# Patient Record
Sex: Male | Born: 1975 | Race: Black or African American | Hispanic: No | Marital: Single
Health system: Southern US, Community
[De-identification: ages and names within clinical notes are randomized; demographics above are authoritative.]

## PROBLEM LIST (undated history)

## (undated) DIAGNOSIS — F1910 Other psychoactive substance abuse, uncomplicated: Secondary | ICD-10-CM

---

## 2006-11-12 ENCOUNTER — Emergency Department: Payer: Self-pay

## 2009-02-26 ENCOUNTER — Emergency Department: Payer: Self-pay | Admitting: Emergency Medicine

## 2009-10-30 ENCOUNTER — Emergency Department: Payer: Self-pay | Admitting: Emergency Medicine

## 2009-11-11 ENCOUNTER — Emergency Department: Payer: Self-pay | Admitting: Emergency Medicine

## 2010-08-01 ENCOUNTER — Emergency Department: Payer: Self-pay | Admitting: Emergency Medicine
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
## 2011-08-10 ENCOUNTER — Emergency Department: Payer: Self-pay | Admitting: Emergency Medicine

## 2012-05-13 ENCOUNTER — Encounter (HOSPITAL_COMMUNITY): Payer: Self-pay | Admitting: Emergency Medicine

## 2012-05-13 ENCOUNTER — Emergency Department (HOSPITAL_COMMUNITY)
Admission: EM | Admit: 2012-05-13 | Discharge: 2012-05-13 | Disposition: A | Discharge status: Home / Self Care | Payer: Self-pay | Attending: Emergency Medicine | Admitting: Emergency Medicine

## 2012-05-13 DIAGNOSIS — R112 Nausea with vomiting, unspecified: Secondary | ICD-10-CM | POA: Insufficient documentation

## 2012-05-13 DIAGNOSIS — F17200 Nicotine dependence, unspecified, uncomplicated: Secondary | ICD-10-CM | POA: Insufficient documentation

## 2012-05-13 LAB — BASIC METABOLIC PANEL
CO2: 28 mEq/L (ref 19–32)
Chloride: 95 mEq/L — ABNORMAL LOW (ref 96–112)
Creatinine, Ser: 1.03 mg/dL (ref 0.50–1.35)
Glucose, Bld: 123 mg/dL — ABNORMAL HIGH (ref 70–99)
Sodium: 136 mEq/L (ref 135–145)

## 2012-05-13 LAB — CBC WITH DIFFERENTIAL/PLATELET
Basophils Absolute: 0 10*3/uL (ref 0.0–0.1)
Eosinophils Absolute: 0.2 10*3/uL (ref 0.0–0.7)
Lymphocytes Relative: 19 % (ref 12–46)
MCH: 32.5 pg (ref 26.0–34.0)
MCHC: 35.7 g/dL (ref 30.0–36.0)
Monocytes Absolute: 1.9 10*3/uL — ABNORMAL HIGH (ref 0.1–1.0)
Neutro Abs: 11 10*3/uL — ABNORMAL HIGH (ref 1.7–7.7)
Neutrophils Relative %: 68 % (ref 43–77)
RDW: 13.6 % (ref 11.5–15.5)

## 2012-05-13 MED ORDER — ONDANSETRON HCL 4 MG PO TABS: 4.0000 mg | ORAL_TABLET | Freq: Four times a day (QID) | ORAL | Status: AC

## 2012-05-13 MED ORDER — ONDANSETRON HCL 4 MG/2ML IJ SOLN
4.0000 mg | Freq: Once | INTRAMUSCULAR | Status: AC
  Administered 2012-05-13: 4 mg via INTRAVENOUS
  Filled 2012-05-13: qty 2

## 2012-05-13 NOTE — ED Provider Notes (Signed)
History     CSN: [id number]  Arrival date & time 05/13/12  0900   First MD Initiated Contact with Patient 05/13/12 [phone]      Chief Complaint  Patient presents with  . Emesis    (Consider location/radiation/quality/duration/timing/severity/associated sxs/prior treatment) Patient is a 36 y.o. male presenting with vomiting. The history is provided by the patient.  Emesis  This is a new problem. The current episode started more than 2 days ago. The problem occurs 2 to 4 times per day. Pertinent negatives include no abdominal pain and no fever. Associated symptoms comments: Nausea, vomiting for the past 3 days. He had diarrhea on day one, but none since. No abdominal pain, fever, bloody emesis or stool. No sick contacts..    History reviewed. No pertinent past medical history.  History reviewed. No pertinent past surgical history.  No family history on file.  History  Substance Use Topics  . Smoking status: Current Everyday Smoker  . Smokeless tobacco: Not on file  . Alcohol Use: Yes      Review of Systems  Constitutional: Negative for fever.  Respiratory: Negative for shortness of breath.   Cardiovascular: Negative for chest pain.  Gastrointestinal: Positive for nausea and vomiting. Negative for abdominal pain.  Genitourinary: Negative for dysuria and decreased urine volume.  Skin: Negative for rash.    Allergies  Review of patient's allergies indicates no known allergies.  Home Medications  No current outpatient prescriptions on file.  BP 138/78  Pulse 64  Temp 99.5 F (37.5 C) (Oral)  Resp 20  SpO2 98%  Physical Exam  Constitutional: He appears well-developed and well-nourished. No distress.  HENT:  Head: Normocephalic.  Neck: Normal range of motion. Neck supple.  Cardiovascular: Normal rate and regular rhythm.   Pulmonary/Chest: Effort normal and breath sounds normal.  Abdominal: Soft. Bowel sounds are normal. There is no tenderness. There is no rebound  and no guarding.       Abdomen is completely non-tender to light and deep palpation.  Musculoskeletal: Normal range of motion.  Neurological: He is alert. No cranial nerve deficit.  Skin: Skin is warm and dry. No rash noted.  Psychiatric: He has a normal mood and affect.    ED Course  Procedures (including critical care time)  Labs Reviewed  CBC WITH DIFFERENTIAL - Abnormal; Notable for the following:    WBC 16.2 (*)     Neutro Abs 11.0 (*)     Monocytes Absolute 1.9 (*)     All other components within normal limits  BASIC METABOLIC PANEL - Abnormal; Notable for the following:    Potassium 3.3 (*)     Chloride 95 (*)     Glucose, Bld 123 (*)     All other components within normal limits   Results for orders placed during the hospital encounter of 05/13/12  CBC WITH DIFFERENTIAL      Component Value Range   WBC 16.2 (*) 4.0 - 10.5 K/uL   RBC 4.89  4.22 - 5.81 MIL/uL   Hemoglobin 15.9  13.0 - 17.0 g/dL   HCT 11.9  14.7 - 82.9 %   MCV 91.2  78.0 - 100.0 fL   MCH 32.5  26.0 - 34.0 pg   MCHC 35.7  30.0 - 36.0 g/dL   RDW 56.2  13.0 - 86.5 %   Platelets 236  150 - 400 K/uL   Neutrophils Relative 68  43 - 77 %   Lymphocytes Relative 19  12 - 46 %  Monocytes Relative 12  3 - 12 %   Eosinophils Relative 1  0 - 5 %   Basophils Relative 0  0 - 1 %   Neutro Abs 11.0 (*) 1.7 - 7.7 K/uL   Lymphs Abs 3.1  0.7 - 4.0 K/uL   Monocytes Absolute 1.9 (*) 0.1 - 1.0 K/uL   Eosinophils Absolute 0.2  0.0 - 0.7 K/uL   Basophils Absolute 0.0  0.0 - 0.1 K/uL   Smear Review MORPHOLOGY UNREMARKABLE    BASIC METABOLIC PANEL      Component Value Range   Sodium 136  135 - 145 mEq/L   Potassium 3.3 (*) 3.5 - 5.1 mEq/L   Chloride 95 (*) 96 - 112 mEq/L   CO2 28  19 - 32 mEq/L   Glucose, Bld 123 (*) 70 - 99 mg/dL   BUN 17  6 - 23 mg/dL   Creatinine, Ser 1.61  0.50 - 1.35 mg/dL   Calcium 09.6  8.4 - 04.5 mg/dL   GFR calc non Af Amer >90  >90 mL/min   GFR calc Af Amer >90  >90 mL/min    No  results found.   No diagnosis found. 1. Nausea and vomiting   MDM  IV fluids given. 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 Suspect this is reaction to vomiting. No vomiting here. Tolerating PO fluids.         Rodena Medin, PA-C 05/13/12 1100

## 2012-05-13 NOTE — ED Notes (Signed)
Pt. Stated, I've been vomiting for 3 days, I've been attempting to rink and no success.

## 2012-05-16 NOTE — ED Provider Notes (Signed)
Medical screening examination/treatment/procedure(s) were performed by non-physician practitioner and as supervising physician I was immediately available for consultation/collaboration.  Cynda Soule R. Monterius Rolf, MD 05/16/12 2008 

## 2012-09-22 ENCOUNTER — Emergency Department: Payer: Self-pay | Admitting: Emergency Medicine

## 2013-01-31 ENCOUNTER — Emergency Department: Payer: Self-pay | Admitting: Emergency Medicine

## 2013-01-31 LAB — CBC
HCT: 42.3 % (ref 40.0–52.0)
MCHC: 34.4 g/dL (ref 32.0–36.0)
MCV: 95 fL (ref 80–100)
Platelet: 205 10*3/uL (ref 150–440)
RDW: 13.9 % (ref 11.5–14.5)
WBC: 8.3 10*3/uL (ref 3.8–10.6)

## 2013-01-31 LAB — DRUG SCREEN, URINE
Benzodiazepine, Ur Scrn: NEGATIVE (ref ?–200)
Cannabinoid 50 Ng, Ur ~~LOC~~: POSITIVE (ref ?–50)
Cocaine Metabolite,Ur ~~LOC~~: POSITIVE (ref ?–300)
MDMA (Ecstasy)Ur Screen: NEGATIVE (ref ?–500)
Methadone, Ur Screen: NEGATIVE (ref ?–300)
Opiate, Ur Screen: NEGATIVE (ref ?–300)

## 2013-01-31 LAB — COMPREHENSIVE METABOLIC PANEL
Alkaline Phosphatase: 81 U/L (ref 50–136)
Anion Gap: 5 — ABNORMAL LOW (ref 7–16)
BUN: 12 mg/dL (ref 7–18)
Co2: 28 mmol/L (ref 21–32)
Creatinine: 1.25 mg/dL (ref 0.60–1.30)
Glucose: 78 mg/dL (ref 65–99)
Potassium: 3.7 mmol/L (ref 3.5–5.1)
SGOT(AST): 23 U/L (ref 15–37)
Sodium: 141 mmol/L (ref 136–145)
Total Protein: 7.4 g/dL (ref 6.4–8.2)

## 2013-01-31 LAB — URINALYSIS, COMPLETE
Bacteria: NONE SEEN
Glucose,UR: NEGATIVE mg/dL (ref 0–75)
Ketone: NEGATIVE
Leukocyte Esterase: NEGATIVE
Nitrite: NEGATIVE
Ph: 5 (ref 4.5–8.0)
RBC,UR: 8 /HPF (ref 0–5)

## 2013-01-31 LAB — ETHANOL: Ethanol %: <0.003 % (ref 0.000–0.080)

## 2015-06-04 ENCOUNTER — Emergency Department
Admission: EM | Admit: 2015-06-04 | Discharge: 2015-06-04 | Disposition: A | Discharge status: Home / Self Care | Payer: Self-pay

## 2015-06-04 NOTE — ED Notes (Signed)
Pt called twice for triage ,no answer.

## 2015-11-17 ENCOUNTER — Emergency Department
Admission: EM | Admit: 2015-11-17 | Discharge: 2015-11-17 | Disposition: A | Discharge status: Home / Self Care | Payer: Self-pay | Attending: Emergency Medicine | Admitting: Emergency Medicine

## 2015-11-17 ENCOUNTER — Encounter: Payer: Self-pay | Admitting: Emergency Medicine

## 2015-11-17 DIAGNOSIS — F1420 Cocaine dependence, uncomplicated: Secondary | ICD-10-CM | POA: Insufficient documentation

## 2015-11-17 DIAGNOSIS — F17200 Nicotine dependence, unspecified, uncomplicated: Secondary | ICD-10-CM | POA: Insufficient documentation

## 2015-11-17 DIAGNOSIS — F1920 Other psychoactive substance dependence, uncomplicated: Secondary | ICD-10-CM

## 2015-11-17 DIAGNOSIS — F1220 Cannabis dependence, uncomplicated: Secondary | ICD-10-CM | POA: Insufficient documentation

## 2015-11-17 HISTORY — DX: Other psychoactive substance abuse, uncomplicated: F19.10

## 2015-11-17 NOTE — ED Provider Notes (Signed)
[facility] Emergency Department Provider Note  ____________________________________________  Time seen: On arrival  I have reviewed the triage vital signs and the nursing notes.   HISTORY  Chief Complaint Addiction Problem    HPI Alexander Garcia is a 40 y.o. male who is here because he wants rehabilitation/detox for marijuana and crack/cocaine. He reports that he has relapsed over the last 6 months prior to that he had been clean for 2 years after rehabilitation program. He has no physical complaints and feels well currently    Past Medical History  Diagnosis Date  . Drug abuse     There are no active problems to display for this patient.   History reviewed. No pertinent past surgical history.  No current outpatient prescriptions on file.  Allergies Review of patient's allergies indicates no known allergies.  History reviewed. No pertinent family history.  Social History Social History  Substance Use Topics  . Smoking status: Current Every Day Smoker  . Smokeless tobacco: None  . Alcohol Use: Yes    Review of Systems  Constitutional: Negative for fever. Eyes: Negative for visual changes. CV: No chest pain Respiratory: No cough   Musculoskeletal: Negative for joint pain or swelling Skin: Negative for rash. Neurological: Negative for headaches    ____________________________________________   PHYSICAL EXAM:  VITAL SIGNS: ED Triage Vitals  Enc Vitals Group     BP 11/17/15 1102 149/78 mmHg     Pulse Rate 11/17/15 1102 61     Resp 11/17/15 1102 20     Temp 11/17/15 1102 98 F (36.7 C)     Temp Source 11/17/15 1102 Oral     SpO2 11/17/15 1102 97 %     Weight 11/17/15 1102 205 lb (92.987 kg)     Height 11/17/15 1102 6' (1.829 m)     Head Cir --      Peak Flow --      Pain Score 11/17/15 1103 0     Pain Loc --      Pain Edu? --      Excl. in GC? --     Constitutional: Alert and oriented. Well appearing and in no  distress. Pleasant and interactive Eyes: Conjunctivae are normal.  ENT   Head: Normocephalic and atraumatic.   Mouth/Throat: Mucous membranes are moist.  Respiratory: Normal respiratory effort without tachypnea nor retractions.   Musculoskeletal: Nontender with normal range of motion in all extremities. Neurologic:  Normal speech and language. No gross focal neurologic deficits are appreciated. Skin:  Skin is warm, dry and intact. No rash noted. Psychiatric: Mood and affect are normal. Patient exhibits appropriate insight and judgment.  ____________________________________________    LABS (pertinent positives/negatives)  Labs Reviewed - No data to display  ____________________________________________     ____________________________________________    RADIOLOGY I have personally reviewed any xrays that were ordered on this patient: None  ____________________________________________   PROCEDURES  Procedure(s) performed: none   ____________________________________________   INITIAL IMPRESSION / ASSESSMENT AND PLAN / ED COURSE  Pertinent labs & imaging results that were available during my care of the patient were reviewed by me and considered in my medical decision making (see chart for details).  Outpatient resources provided for patient. No indication for labs or further evaluation in the emergency department  ____________________________________________   FINAL CLINICAL IMPRESSION(S) / ED DIAGNOSES  Final diagnoses:  Substance dependence (HCC)     Jene Everyobert Donaciano Range, MD 11/17/15 1439

## 2015-11-17 NOTE — Discharge Instructions (Signed)
Finding Treatment for Addiction WHAT IS ADDICTION? Addiction is a complex disease of the brain. It causes an uncontrollable (compulsive) need for a substance. You can be addicted to alcohol, illegal drugs, or prescription medicines such as painkillers. Addiction can also be a behavior, like gambling or shopping. The need for the drug or activity can become so strong that you think about it all the time. You can also become physically dependent on a substance. Addiction can change the way your brain works. Because of these changes, getting more of whatever you are addicted to becomes the most important thing to you and feels better than other activities or relationships. Addiction can lead to changes in health, behavior, emotions, relationships, and choices that affect you and everyone around you. HOW DO I KNOW IF I NEED TREATMENT FOR ADDICTION? Addiction is a progressive disease. Without treatment, addiction can get worse. Living with addiction puts you at higher risk for injury, poor health, lost employment, loss of money, and even death. You might need treatment for addiction if:  You have tried to stop or cut down, but you cannot.  Your addiction is causing physical health problems.  You find it annoying that your friends and family are concerned about your alcohol or substance use.  You feel guilty about substance abuse or a compulsive behavior.  You have lied or tried to hide your addiction.  You need a particular substance or activity to start your day or to calm down.  You are getting in trouble at school, work, home, or with the police.  You have done something illegal to support your addiction.  You are running out of money because of your addiction.  You have no time for anything other than your addiction. WHAT TYPES OF TREATMENT ARE AVAILABLE? The treatment program that is right for you will depend on many factors, including the type of addiction you have. Treatment programs  can be outpatient or inpatient. In an outpatient program, you live at home and go to work or school, but you also go to a clinic for treatment. With an inpatient program, you live and sleep at the program facility during treatment. After treatment, you might need a plan for support during recovery. Other treatment options include:   Medicine.  Some addictions may be treated with prescription medicines.  You might also need medicine to treat anxiety or depression.  Counseling and behavior therapy. Therapy can help individuals and families behave in healthier ways and relate more effectively.  Support groups. Confidential group therapy, such as a 12-step program, can help individuals and families during treatment and recovery. No single type of program is right for everyone. Many treatment programs involve a combination of education, counseling, and a 12-step, spiritually-based approach. Some treatment programs are government sponsored. They are geared for patients who do not have private insurance. Treatment programs can vary in many respects, such as:  Cost and types of insurance that are accepted.  Types of on-site medical services that are offered.  Length of stay, setting, and size.  Overall philosophy of treatment. WHAT SHOULD I CONSIDER WHEN SELECTING A TREATMENT PROGRAM? It is important to think about your individual requirements when selecting a treatment program. There are a number of things to consider, such as:  If the program is certified by the appropriate government agency. Even private programs must be certified and employ certified professionals.  If the program is covered by your insurance. If finances are a concern, the first call you should make   is to your insurance company, if you have health insurance. Ask for a list of treatment programs that are in your network, and confirm any copayments and deductibles that you may have to pay.  If you do not have insurance, or if  you choose to attend a program that does not accept your insurance, discuss whether a payment plan can be set up.  If treatment is available in languages other than English, if needed.  If the program offers detoxification treatment, if needed.  If 12-step meetings are held at the center or if transport is available for patients to attend meetings at other locations.  If the program is professional, organized, and clean.  If the program meets all of your needs, including physical and cultural needs.  If the facility offers specific treatment for your particular addiction.  If support continues to be offered after you have left the program.  If your treatment plan is continually looked at to make sure you are receiving the right treatment at the right time.  If mental health counseling is part of your treatment.  If medicine is included in treatment, if needed.  If your family is included in your treatment plan and if support is offered to them throughout the treatment process.  How the treatment works to prevent relapse. WHERE ELSE CAN I GET HELP?  Your health care provider. Ask him or her to help you find addiction treatment. These discussions are confidential.  The National Council on Alcoholism and Drug Dependence (NCADD). This group has information about treatment centers and programs for people who have an addiction and for family members.  The telephone number is 1-800-NCA-CALL (1-800-622-2255).  The website is https://ncadd.org/about-ncadd/our-affiliates  The Substance Abuse and Mental Health Services Administration (SAMHSA). This group will help you find publicly funded treatment centers, help hotlines, and counseling services near you.  The telephone number is 1-800-662-HELP (1-800-662-4357).  The website is www.findtreatment.samhsa.gov In countries outside of the [place] and [place], look in local directories for contact information for services in your area.   This  information is not intended to replace advice given to you by your health care provider. Make sure you discuss any questions you have with your health care provider.   Document Released: 07/22/2005 Document Revised: 05/14/2015 Document Reviewed: 06/11/2014 Elsevier Interactive Patient Education 2016 Elsevier Inc.  

## 2015-11-17 NOTE — ED Notes (Signed)
Pt to ed with c/o wanting rehab for marijuana and crack/cocaine.  Pt states hx of drug addiction and successful rehab for several years but recently relapsed. Denies SI, denies HI, denies hallucinations.

## 2019-09-03 ENCOUNTER — Emergency Department
Admission: EM | Admit: 2019-09-03 | Discharge: 2019-09-04 | Disposition: A | Discharge status: Other Acute Inpatient Hospital | Payer: Self-pay | Attending: Student in an Organized Health Care Education/Training Program | Admitting: Student in an Organized Health Care Education/Training Program

## 2019-09-03 ENCOUNTER — Other Ambulatory Visit: Payer: Self-pay

## 2019-09-03 ENCOUNTER — Encounter: Payer: Self-pay | Admitting: Emergency Medicine

## 2019-09-03 DIAGNOSIS — Z20828 Contact with and (suspected) exposure to other viral communicable diseases: Secondary | ICD-10-CM | POA: Insufficient documentation

## 2019-09-03 DIAGNOSIS — F17200 Nicotine dependence, unspecified, uncomplicated: Secondary | ICD-10-CM | POA: Insufficient documentation

## 2019-09-03 DIAGNOSIS — F1910 Other psychoactive substance abuse, uncomplicated: Secondary | ICD-10-CM | POA: Insufficient documentation

## 2019-09-03 LAB — CBC WITH DIFFERENTIAL/PLATELET
Abs Immature Granulocytes: 0.02 10*3/uL (ref 0.00–0.07)
Basophils Absolute: 0.1 10*3/uL (ref 0.0–0.1)
Basophils Relative: 1 %
Eosinophils Absolute: 0.1 10*3/uL (ref 0.0–0.5)
Eosinophils Relative: 1 %
HCT: 44.2 % (ref 39.0–52.0)
Hemoglobin: 15.8 g/dL (ref 13.0–17.0)
Immature Granulocytes: 0 %
Lymphocytes Relative: 28 %
Lymphs Abs: 2.5 10*3/uL (ref 0.7–4.0)
MCH: 31.5 pg (ref 26.0–34.0)
MCHC: 35.7 g/dL (ref 30.0–36.0)
MCV: 88.2 fL (ref 80.0–100.0)
Monocytes Absolute: 0.9 10*3/uL (ref 0.1–1.0)
Monocytes Relative: 10 %
Neutro Abs: 5.5 10*3/uL (ref 1.7–7.7)
Neutrophils Relative %: 60 %
Platelets: 310 10*3/uL (ref 150–400)
RBC: 5.01 MIL/uL (ref 4.22–5.81)
RDW: 13.9 % (ref 11.5–15.5)
WBC: 9.1 10*3/uL (ref 4.0–10.5)
nRBC: 0 % (ref 0.0–0.2)

## 2019-09-03 LAB — COMPREHENSIVE METABOLIC PANEL
ALT: 20 U/L (ref 0–44)
AST: 26 U/L (ref 15–41)
Albumin: 4.9 g/dL (ref 3.5–5.0)
Alkaline Phosphatase: 69 U/L (ref 38–126)
Anion gap: 11 (ref 5–15)
BUN: 15 mg/dL (ref 6–20)
CO2: 23 mmol/L (ref 22–32)
Calcium: 9.5 mg/dL (ref 8.9–10.3)
Chloride: 107 mmol/L (ref 98–111)
Creatinine, Ser: 1.13 mg/dL (ref 0.61–1.24)
GFR calc Af Amer: >60 mL/min (ref >60)
GFR calc non Af Amer: >60 mL/min (ref >60)
Glucose, Bld: 104 mg/dL — ABNORMAL HIGH (ref 70–99)
Potassium: 4 mmol/L (ref 3.5–5.1)
Sodium: 141 mmol/L (ref 135–145)
Total Bilirubin: 0.5 mg/dL (ref 0.3–1.2)
Total Protein: 7.9 g/dL (ref 6.5–8.1)

## 2019-09-03 LAB — ETHANOL: Alcohol, Ethyl (B): <10 mg/dL (ref <10)

## 2019-09-03 LAB — RESPIRATORY PANEL BY RT PCR (FLU A&B, COVID)
Influenza A by PCR: NEGATIVE
Influenza B by PCR: NEGATIVE
SARS Coronavirus 2 by RT PCR: NEGATIVE

## 2019-09-03 NOTE — ED Notes (Signed)
Snack and beverage given. 

## 2019-09-03 NOTE — ED Notes (Signed)
Hourly rounding reveals patient sleeping in room. No complaints, stable, in no acute distress. Q15 minute rounds and monitoring via Security Cameras to continue. 

## 2019-09-03 NOTE — ED Notes (Signed)
Patient has been accepted to [facility].  Patient assigned to room [facility] Accepting physician is Dr. Jonelle Sports.  Call report to [phone].    ER Staff is aware of it:  ER Secretary  Dr.  ER MD  Dominica Severin Patient's Nurse       Pt can be transported after 8AM

## 2019-09-03 NOTE — ED Notes (Signed)
Report to include Situation, Background, Assessment, and Recommendations received from Amy RN. Patient alert and oriented, warm and dry, in no acute distress. Patient denies SI, HI, AVH and pain. Patient made aware of Q15 minute rounds and Rover and Officer presence for their safety. Patient instructed to come to me with needs or concerns.   

## 2019-09-03 NOTE — ED Notes (Signed)
Pt. Transferred to [facility] from ED to room 4 after screening for contraband. Report to include Situation, Background, Assessment and Recommendations from Hewan RN. Pt. Oriented to unit including Q15 minute rounds as well as the security cameras for their protection. Patient is alert and oriented, warm and dry in no acute distress. Patient denies SI, HI, and AVH. Pt. Encouraged to let me know if needs arise. 
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
## 2019-09-03 NOTE — ED Notes (Signed)
Hourly rounding reveals patient in room. No complaints, stable, in no acute distress. Q15 minute rounds and monitoring via Rover and Officer to continue.   

## 2019-09-04 NOTE — ED Notes (Signed)
Pt states he is agreeable to going to [facility] after TTS explained admission was initiated PTA. Pt remains alert and oriented, calm and cooperative at this time.

## 2019-09-04 NOTE — ED Notes (Signed)
TTS at bedside to speak with patient regarding transfer to [facility].

## 2019-09-04 NOTE — ED Notes (Signed)
Pt given meal tray at this time. VS obtained. VSS. Pt remains A&O x4, calm and cooperative.

## 2019-09-04 NOTE — ED Notes (Signed)
Hourly rounding reveals patient sleeping in room. No complaints, stable, in no acute distress. Q15 minute rounds and monitoring via Security Cameras to continue. 

## 2019-09-04 NOTE — ED Provider Notes (Signed)
-----------------------------------------   2:21 AM on 09/04/2019 -----------------------------------------   Blood pressure 135/76, pulse 60, temperature 98.6 F (37 C), temperature source Oral, resp. rate 16, height 1.829 m (6'), weight 88.5 kg, SpO2 98 %.  The patient is resting at this time.  Kennyth Lose (psych NP) told me that she does not need to see the patient since he was already seen and placed via [facility].  He will go to [facility] later today.   Hinda Kehr, MD 09/04/19 [phone]

## 2019-09-04 NOTE — ED Notes (Signed)
IVC/Pending transfer to [facility].

## 2019-09-04 NOTE — ED Notes (Signed)
IVC/Pending transfer to [facility]. 

## 2019-09-04 NOTE — ED Notes (Signed)
Pt visualized in NAD at this time. Pt ambulatory around the unit without difficulty. Pt ambulatory to the bathroom independently at this time.

## 2019-09-04 NOTE — ED Notes (Signed)
SHERIFF  DEPT  CALLED FOR  TRANSPORT  TO  [facility] 

## 2019-09-04 NOTE — ED Notes (Signed)
EMTALA reviewed. 

## 2019-09-04 NOTE — ED Notes (Signed)
NAD noted at time of transfer to [facility] via [place]. Pt ambulatory without difficulty at this time with [facility].

## 2019-11-19 ENCOUNTER — Ambulatory Visit: Payer: Self-pay | Attending: Internal Medicine

## 2019-11-19 DIAGNOSIS — Z20822 Contact with and (suspected) exposure to covid-19: Secondary | ICD-10-CM

## 2019-11-21 LAB — NOVEL CORONAVIRUS, NAA: SARS-CoV-2, NAA: NOT DETECTED

## 2019-12-22 ENCOUNTER — Ambulatory Visit: Payer: Self-pay | Attending: Internal Medicine

## 2019-12-22 DIAGNOSIS — Z23 Encounter for immunization: Secondary | ICD-10-CM

## 2019-12-22 NOTE — Progress Notes (Signed)
   Covid-19 Vaccination Clinic  Name:  Alexander Garcia    MRN: [id number] DOB: 08/27/1976  12/22/2019  [name] was observed post Covid-19 immunization for 15 minutes without incident. He was provided with Vaccine Information Sheet and instruction to access the V-Safe system.   [name] was instructed to call 911 with any severe reactions post vaccine: Marland Kitchen Difficulty breathing  . Swelling of face and throat  . A fast heartbeat  . A bad rash all over body  . Dizziness and weakness   Immunizations Administered    Name Date Dose VIS Date Route   Pfizer COVID-19 Vaccine 12/22/2019  9:59 AM 0.3 mL 08/17/2019 Intramuscular   Manufacturer: [employer], [employer]   Lot: [id number]   NDC: [address]-5

## 2020-01-15 ENCOUNTER — Ambulatory Visit: Payer: Self-pay | Attending: Internal Medicine

## 2020-01-15 DIAGNOSIS — Z23 Encounter for immunization: Secondary | ICD-10-CM

## 2020-01-15 NOTE — Progress Notes (Signed)
   Covid-19 Vaccination Clinic  Name:  Alexander Garcia    MRN: [id number] DOB: [birth year]  01/15/2020  Mr. Alexander Garcia was observed post Covid-19 immunization for 15 minutes without incident. He was provided with Vaccine Information Sheet and instruction to access the V-Safe system.   Mr. Alexander Garcia was instructed to call 911 with any severe reactions post vaccine: Marland Kitchen Difficulty breathing  . Swelling of face and throat  . A fast heartbeat  . A bad rash all over body  . Dizziness and weakness   Immunizations Administered    Name Date Dose VIS Date Route   Pfizer COVID-19 Vaccine 01/15/2020  4:27 PM 0.3 mL 10/31/2018 Intramuscular   Manufacturer: [employer], [employer]   Lot: [id number]   NDC: [address]-2

## 2020-02-26 ENCOUNTER — Other Ambulatory Visit: Payer: Self-pay

## 2020-02-26 ENCOUNTER — Emergency Department
Admission: EM | Admit: 2020-02-26 | Discharge: 2020-02-26 | Disposition: A | Discharge status: Home / Self Care | Payer: Self-pay | Attending: Student in an Organized Health Care Education/Training Program | Admitting: Student in an Organized Health Care Education/Training Program

## 2020-02-26 ENCOUNTER — Emergency Department: Payer: Self-pay

## 2020-02-26 ENCOUNTER — Encounter: Payer: Self-pay | Admitting: Emergency Medicine

## 2020-02-26 DIAGNOSIS — F17210 Nicotine dependence, cigarettes, uncomplicated: Secondary | ICD-10-CM | POA: Insufficient documentation

## 2020-02-26 DIAGNOSIS — L03116 Cellulitis of left lower limb: Secondary | ICD-10-CM | POA: Insufficient documentation

## 2020-02-26 DIAGNOSIS — R112 Nausea with vomiting, unspecified: Secondary | ICD-10-CM

## 2020-02-26 DIAGNOSIS — L02416 Cutaneous abscess of left lower limb: Secondary | ICD-10-CM | POA: Insufficient documentation

## 2020-02-26 LAB — URINALYSIS, COMPLETE (UACMP) WITH MICROSCOPIC
Bacteria, UA: NONE SEEN
Bilirubin Urine: NEGATIVE
Glucose, UA: NEGATIVE mg/dL
Hgb urine dipstick: NEGATIVE
Ketones, ur: 80 mg/dL — AB
Leukocytes,Ua: NEGATIVE
Nitrite: NEGATIVE
Protein, ur: NEGATIVE mg/dL
Specific Gravity, Urine: 1.028 (ref 1.005–1.030)
Squamous Epithelial / LPF: NONE SEEN (ref 0–5)
pH: 6 (ref 5.0–8.0)

## 2020-02-26 LAB — COMPREHENSIVE METABOLIC PANEL
ALT: 18 U/L (ref 0–44)
AST: 22 U/L (ref 15–41)
Albumin: 4.3 g/dL (ref 3.5–5.0)
Alkaline Phosphatase: 64 U/L (ref 38–126)
Anion gap: 8 (ref 5–15)
BUN: 24 mg/dL — ABNORMAL HIGH (ref 6–20)
CO2: 24 mmol/L (ref 22–32)
Calcium: 9.2 mg/dL (ref 8.9–10.3)
Chloride: 105 mmol/L (ref 98–111)
Creatinine, Ser: 0.97 mg/dL (ref 0.61–1.24)
GFR calc Af Amer: >60 mL/min (ref >60)
GFR calc non Af Amer: >60 mL/min (ref >60)
Glucose, Bld: 140 mg/dL — ABNORMAL HIGH (ref 70–99)
Potassium: 4.2 mmol/L (ref 3.5–5.1)
Sodium: 137 mmol/L (ref 135–145)
Total Bilirubin: 1 mg/dL (ref 0.3–1.2)
Total Protein: 7.5 g/dL (ref 6.5–8.1)

## 2020-02-26 LAB — CBC
HCT: 39.6 % (ref 39.0–52.0)
Hemoglobin: 13.7 g/dL (ref 13.0–17.0)
MCH: 31.6 pg (ref 26.0–34.0)
MCHC: 34.6 g/dL (ref 30.0–36.0)
MCV: 91.5 fL (ref 80.0–100.0)
Platelets: 309 10*3/uL (ref 150–400)
RBC: 4.33 MIL/uL (ref 4.22–5.81)
RDW: 13.3 % (ref 11.5–15.5)
WBC: 11.4 10*3/uL — ABNORMAL HIGH (ref 4.0–10.5)
nRBC: 0 % (ref 0.0–0.2)

## 2020-02-26 LAB — LIPASE, BLOOD: Lipase: 23 U/L (ref 11–51)

## 2020-02-26 MED ORDER — HYDROCODONE-ACETAMINOPHEN 5-325 MG PO TABS
1.0000 | ORAL_TABLET | Freq: Once | ORAL | Status: AC
  Administered 2020-02-26: 1 via ORAL
  Filled 2020-02-26: qty 1

## 2020-02-26 MED ORDER — ONDANSETRON HCL 4 MG/2ML IJ SOLN
4.0000 mg | Freq: Once | INTRAMUSCULAR | Status: AC
  Administered 2020-02-26: 4 mg via INTRAVENOUS
  Filled 2020-02-26: qty 2

## 2020-02-26 MED ORDER — HYDROCODONE-ACETAMINOPHEN 5-325 MG PO TABS: 1.0000 | ORAL_TABLET | ORAL | 0 refills | Status: AC | PRN

## 2020-02-26 MED ORDER — ONDANSETRON HCL 4 MG PO TABS: 4.0000 mg | ORAL_TABLET | Freq: Every day | ORAL | 0 refills | Status: AC | PRN

## 2020-02-26 MED ORDER — LIDOCAINE-PRILOCAINE 2.5-2.5 % EX CREA
TOPICAL_CREAM | Freq: Once | CUTANEOUS | Status: AC
  Administered 2020-02-26: 1 via TOPICAL
  Filled 2020-02-26: qty 5

## 2020-02-26 MED ORDER — SULFAMETHOXAZOLE-TRIMETHOPRIM 800-160 MG PO TABS
1.0000 | ORAL_TABLET | Freq: Once | ORAL | Status: AC
  Administered 2020-02-26: 1 via ORAL
  Filled 2020-02-26: qty 1

## 2020-02-26 MED ORDER — ONDANSETRON 4 MG PO TBDP
4.0000 mg | ORAL_TABLET | Freq: Once | ORAL | Status: DC | PRN
  Filled 2020-02-26 (×2): qty 1

## 2020-02-26 MED ORDER — SODIUM CHLORIDE 0.9 % IV BOLUS
500.0000 mL | Freq: Once | INTRAVENOUS | Status: AC
  Administered 2020-02-26: 500 mL via INTRAVENOUS

## 2020-02-26 NOTE — ED Triage Notes (Signed)
Patient reports vomiting multiple times since yesterday. Also complaining of abdominal pain. Denies fever.  Patient also states he has boils on legs that have been there for a while and not sure if they need abx.

## 2020-02-26 NOTE — ED Provider Notes (Signed)
[facility] Emergency Department Provider Note    First MD Initiated Contact with Patient 02/26/20 1108     (approximate)  I have reviewed the triage vital signs and the nursing notes.   HISTORY  Chief Complaint Emesis    HPI Alexander Garcia is a 44 y.o. male the below listed past medical history presents to the ER for evaluation of epigastric discomfort associated with nausea and vomiting as well as some watery nonbloody nonmelanotic stool as well as a boil to the left anterior thigh that is draining more that needs antibiotics.  States he has a history of boils has not been on any recent antibiotics.  Denies any chest pain or shortness of breath.  No measured fevers.  States that the boil has been present for over 2 weeks.    Past Medical History:  Diagnosis Date  . Drug abuse (HCC)    No family history on file. History reviewed. No pertinent surgical history. There are no problems to display for this patient.     Prior to Admission medications   Medication Sig Start Date End Date Taking? Authorizing Provider  HYDROcodone-acetaminophen (NORCO) 5-325 MG tablet Take 1 tablet by mouth every 4 (four) hours as needed for moderate pain. 02/26/20   Willy Eddy, MD  ondansetron (ZOFRAN) 4 MG tablet Take 1 tablet (4 mg total) by mouth daily as needed. 02/26/20 02/25/21  Willy Eddy, MD    Allergies Patient has no known allergies.    Social History Social History   Tobacco Use  . Smoking status: Current Every Day Smoker  Substance Use Topics  . Alcohol use: Yes  . Drug use: Yes    Types: Marijuana, Cocaine    Review of Systems Patient denies headaches, rhinorrhea, blurry vision, numbness, shortness of breath, chest pain, edema, cough, abdominal pain, nausea, vomiting, diarrhea, dysuria, fevers, rashes or hallucinations unless otherwise stated above in HPI. ____________________________________________   PHYSICAL EXAM:  VITAL  SIGNS: Vitals:   02/26/20 1328 02/26/20 1335  BP: (!) 143/108 (!) 160/90  Pulse: 63 70  Resp:  18  Temp:  98.5 F (36.9 C)  SpO2: 100% 100%    Constitutional: Alert and oriented.  Eyes: Conjunctivae are normal.  Head: Atraumatic. Nose: No congestion/rhinnorhea. Mouth/Throat: Mucous membranes are moist.   Neck: No stridor. Painless ROM.  Cardiovascular: Normal rate, regular rhythm. Grossly normal heart sounds.  Good peripheral circulation. Respiratory: Normal respiratory effort.  No retractions. Lungs CTAB. Gastrointestinal: Soft and nontender. No distention. No abdominal bruits. No CVA tenderness. Genitourinary:  Musculoskeletal: 3cm fluctuant abscess with purulent drainage and area of surrounding cellulitis.  Compartment soft.  No joint effusions. Neurologic:  Normal speech and language. No gross focal neurologic deficits are appreciated. No facial droop Skin:  Skin is warm, dry and intact. No rash noted. Psychiatric: Mood and affect are normal. Speech and behavior are normal.  ____________________________________________   LABS (all labs ordered are listed, but only abnormal results are displayed)  Results for orders placed or performed during the hospital encounter of 02/26/20 (from the past 24 hour(s))  Lipase, blood     Status: None   Collection Time: 02/26/20 10:24 AM  Result Value Ref Range   Lipase 23 11 - 51 U/L  Comprehensive metabolic panel     Status: Abnormal   Collection Time: 02/26/20 10:24 AM  Result Value Ref Range   Sodium 137 135 - 145 mmol/L   Potassium 4.2 3.5 - 5.1 mmol/L   Chloride 105  98 - 111 mmol/L   CO2 24 22 - 32 mmol/L   Glucose, Bld 140 (H) 70 - 99 mg/dL   BUN 24 (H) 6 - 20 mg/dL   Creatinine, Ser 0.97 0.61 - 1.24 mg/dL   Calcium 9.2 8.9 - 10.3 mg/dL   Total Protein 7.5 6.5 - 8.1 g/dL   Albumin 4.3 3.5 - 5.0 g/dL   AST 22 15 - 41 U/L   ALT 18 0 - 44 U/L   Alkaline Phosphatase 64 38 - 126 U/L   Total Bilirubin 1.0 0.3 - 1.2 mg/dL   GFR  calc non Af Amer >60 >60 mL/min   GFR calc Af Amer >60 >60 mL/min   Anion gap 8 5 - 15  CBC     Status: Abnormal   Collection Time: 02/26/20 10:24 AM  Result Value Ref Range   WBC 11.4 (H) 4.0 - 10.5 K/uL   RBC 4.33 4.22 - 5.81 MIL/uL   Hemoglobin 13.7 13.0 - 17.0 g/dL   HCT 39.6 39 - 52 %   MCV 91.5 80.0 - 100.0 fL   MCH 31.6 26.0 - 34.0 pg   MCHC 34.6 30.0 - 36.0 g/dL   RDW 13.3 11.5 - 15.5 %   Platelets 309 150 - 400 K/uL   nRBC 0.0 0.0 - 0.2 %  Urinalysis, Complete w Microscopic     Status: Abnormal   Collection Time: 02/26/20 10:24 AM  Result Value Ref Range   Color, Urine YELLOW (A) YELLOW   APPearance CLEAR (A) CLEAR   Specific Gravity, Urine 1.028 1.005 - 1.030   pH 6.0 5.0 - 8.0   Glucose, UA NEGATIVE NEGATIVE mg/dL   Hgb urine dipstick NEGATIVE NEGATIVE   Bilirubin Urine NEGATIVE NEGATIVE   Ketones, ur 80 (A) NEGATIVE mg/dL   Protein, ur NEGATIVE NEGATIVE mg/dL   Nitrite NEGATIVE NEGATIVE   Leukocytes,Ua NEGATIVE NEGATIVE   RBC / HPF 21-50 0 - 5 RBC/hpf   WBC, UA 0-5 0 - 5 WBC/hpf   Bacteria, UA NONE SEEN NONE SEEN   Squamous Epithelial / LPF NONE SEEN 0 - 5   Mucus PRESENT    ____________________________________________  ____________________________________________  RADIOLOGY  I personally reviewed all radiographic images ordered to evaluate for the above acute complaints and reviewed radiology reports and findings.  These findings were personally discussed with the patient.  Please see medical record for radiology report.  ____________________________________________   PROCEDURES  Procedure(s) performed:  Marland KitchenMarland KitchenIncision and Drainage  Date/Time: 02/26/2020 12:24 PM Performed by: Merlyn Lot, MD Authorized by: Merlyn Lot, MD   Consent:    Consent obtained:  Verbal   Consent given by:  Patient   Risks discussed:  Bleeding, infection, incomplete drainage and pain   Alternatives discussed:  Alternative treatment, delayed treatment and  observation Location:    Type:  Abscess Anesthesia (see MAR for exact dosages):    Anesthesia method:  Local infiltration   Local anesthetic:  Lidocaine 1% w/o epi Procedure type:    Complexity:  Complex Post-procedure details:    Patient tolerance of procedure:  Tolerated well, no immediate complications      Critical Care performed: no ____________________________________________   INITIAL IMPRESSION / ASSESSMENT AND PLAN / ED COURSE  Pertinent labs & imaging results that were available during my care of the patient were reviewed by me and considered in my medical decision making (see chart for details).   DDX: cellulitis, abscess, appendicitis, cholecystitis, pancreatitis, enteritis  Alexander Garcia is a 44  y.o. who presents to the ED with symptoms as described above as well as evidence of abscess and cellulitis which will require I&D.  He is nontoxic-appearing.  His abdominal exam soft and benign.  Will give IV fluid as well as antiemetic.  Does not seem consistent with acute appendicitis, diverticulitis, colitis pancreatitis.  Blood work otherwise reassuring.  Clinical Course as of Feb 25 1441  Tue Feb 26, 2020  1219 I&D performed of thigh abscess with moderate purulent discharge.  Work-up otherwise reassuring.  Will give dose of antibiotics here to ensure there is tolerating p.o. otherwise appears clinically stable and appropriate for discharge home.   [PR]    Clinical Course User Index [PR] Willy Eddy, MD    The patient was evaluated in Emergency Department today for the symptoms described in the history of present illness. He/she was evaluated in the context of the global COVID-19 pandemic, which necessitated consideration that the patient might be at risk for infection with the SARS-CoV-2 virus that causes COVID-19. Institutional protocols and algorithms that pertain to the evaluation of patients at risk for COVID-19 are in a state of rapid change based on  information released by regulatory bodies including the CDC and federal and state organizations. These policies and algorithms were followed during the patient's care in the ED.  As part of my medical decision making, I reviewed the following data within the electronic MEDICAL RECORD NUMBER Nursing notes reviewed and incorporated, Labs reviewed, notes from prior ED visits and [place] Controlled Substance Database   ____________________________________________   FINAL CLINICAL IMPRESSION(S) / ED DIAGNOSES  Final diagnoses:  Cellulitis and abscess of left lower extremity  Non-intractable vomiting with nausea, unspecified vomiting type      NEW MEDICATIONS STARTED DURING THIS VISIT:  Discharge Medication List as of 02/26/2020 12:26 PM    START taking these medications   Details  HYDROcodone-acetaminophen (NORCO) 5-325 MG tablet Take 1 tablet by mouth every 4 (four) hours as needed for moderate pain., Starting Tue 02/26/2020, Normal    ondansetron (ZOFRAN) 4 MG tablet Take 1 tablet (4 mg total) by mouth daily as needed., Starting Tue 02/26/2020, Until Wed 02/25/2021 at 2359, Normal         Note:  This document was prepared using Dragon voice recognition software and may include unintentional dictation errors.    Willy Eddy, MD 02/26/20 [phone]

## 2020-02-28 ENCOUNTER — Encounter: Payer: Self-pay | Admitting: Emergency Medicine

## 2020-02-28 ENCOUNTER — Emergency Department
Admission: EM | Admit: 2020-02-28 | Discharge: 2020-02-28 | Disposition: A | Discharge status: Home / Self Care | Payer: Self-pay | Attending: Emergency Medicine | Admitting: Emergency Medicine

## 2020-02-28 ENCOUNTER — Other Ambulatory Visit: Payer: Self-pay

## 2020-02-28 DIAGNOSIS — L0292 Furuncle, unspecified: Secondary | ICD-10-CM | POA: Insufficient documentation

## 2020-02-28 DIAGNOSIS — R1110 Vomiting, unspecified: Secondary | ICD-10-CM | POA: Insufficient documentation

## 2020-02-28 DIAGNOSIS — Z5321 Procedure and treatment not carried out due to patient leaving prior to being seen by health care provider: Secondary | ICD-10-CM | POA: Insufficient documentation

## 2020-02-28 LAB — URINALYSIS, COMPLETE (UACMP) WITH MICROSCOPIC
Bacteria, UA: NONE SEEN
Bilirubin Urine: NEGATIVE
Glucose, UA: NEGATIVE mg/dL
Ketones, ur: NEGATIVE mg/dL
Leukocytes,Ua: NEGATIVE
Nitrite: NEGATIVE
Protein, ur: NEGATIVE mg/dL
Specific Gravity, Urine: 1.021 (ref 1.005–1.030)
Squamous Epithelial / HPF: NONE SEEN (ref 0–5)
pH: 5 (ref 5.0–8.0)

## 2020-02-28 LAB — COMPREHENSIVE METABOLIC PANEL
ALT: 24 U/L (ref 0–44)
AST: 25 U/L (ref 15–41)
Albumin: 4.7 g/dL (ref 3.5–5.0)
Alkaline Phosphatase: 67 U/L (ref 38–126)
Anion gap: 13 (ref 5–15)
BUN: 24 mg/dL — ABNORMAL HIGH (ref 6–20)
CO2: 28 mmol/L (ref 22–32)
Calcium: 9.5 mg/dL (ref 8.9–10.3)
Chloride: 95 mmol/L — ABNORMAL LOW (ref 98–111)
Creatinine, Ser: 1.32 mg/dL — ABNORMAL HIGH (ref 0.61–1.24)
GFR calc Af Amer: >60 mL/min (ref >60)
GFR calc non Af Amer: >60 mL/min (ref >60)
Glucose, Bld: 103 mg/dL — ABNORMAL HIGH (ref 70–99)
Potassium: 4.1 mmol/L (ref 3.5–5.1)
Sodium: 136 mmol/L (ref 135–145)
Total Bilirubin: 0.9 mg/dL (ref 0.3–1.2)
Total Protein: 8.2 g/dL — ABNORMAL HIGH (ref 6.5–8.1)

## 2020-02-28 LAB — LIPASE, BLOOD: Lipase: 71 U/L — ABNORMAL HIGH (ref 11–51)

## 2020-02-28 LAB — CBC
HCT: 42.6 % (ref 39.0–52.0)
Hemoglobin: 14.5 g/dL (ref 13.0–17.0)
MCH: 31.5 pg (ref 26.0–34.0)
MCHC: 34 g/dL (ref 30.0–36.0)
MCV: 92.4 fL (ref 80.0–100.0)
Platelets: 326 10*3/uL (ref 150–400)
RBC: 4.61 MIL/uL (ref 4.22–5.81)
RDW: 13.2 % (ref 11.5–15.5)
WBC: 10.9 10*3/uL — ABNORMAL HIGH (ref 4.0–10.5)
nRBC: 0 % (ref 0.0–0.2)

## 2020-02-28 MED ORDER — SODIUM CHLORIDE 0.9% FLUSH: 3.0000 mL | Freq: Once | INTRAVENOUS | Status: DC

## 2020-02-28 NOTE — ED Triage Notes (Signed)
Pt to ED via POV pt c/o vomiting x 2 days. Pt states that he also has multiple boils on his body, pt states that he had some of then I & D'ed 2 days ago when he was here. Pt stating that he needs antibiotics due to possible cellulitis. Pt states that he has not been able to eat in 3 days. Pt is in NAD.

## 2020-02-28 NOTE — ED Notes (Signed)
Pt given urine cup for sample.

## 2020-02-28 NOTE — ED Notes (Signed)
Pt called for room, no answer, pt not visualized in ED or outside.

## 2020-03-25 ENCOUNTER — Telehealth: Payer: Self-pay | Admitting: General Practice

## 2020-03-25 NOTE — Telephone Encounter (Signed)
Individual has been contacted 3+ times regarding ED referral. No further attempts to contact individual will be made. 

## 2021-02-17 IMAGING — CR DG ABDOMEN ACUTE W/ 1V CHEST
3 series · 3 of 3 positions shown · non-contrast
Comparison: None.

CLINICAL DATA: Epigastric pain

EXAM:
DG ABDOMEN ACUTE W/ 1V CHEST

[chest pa]
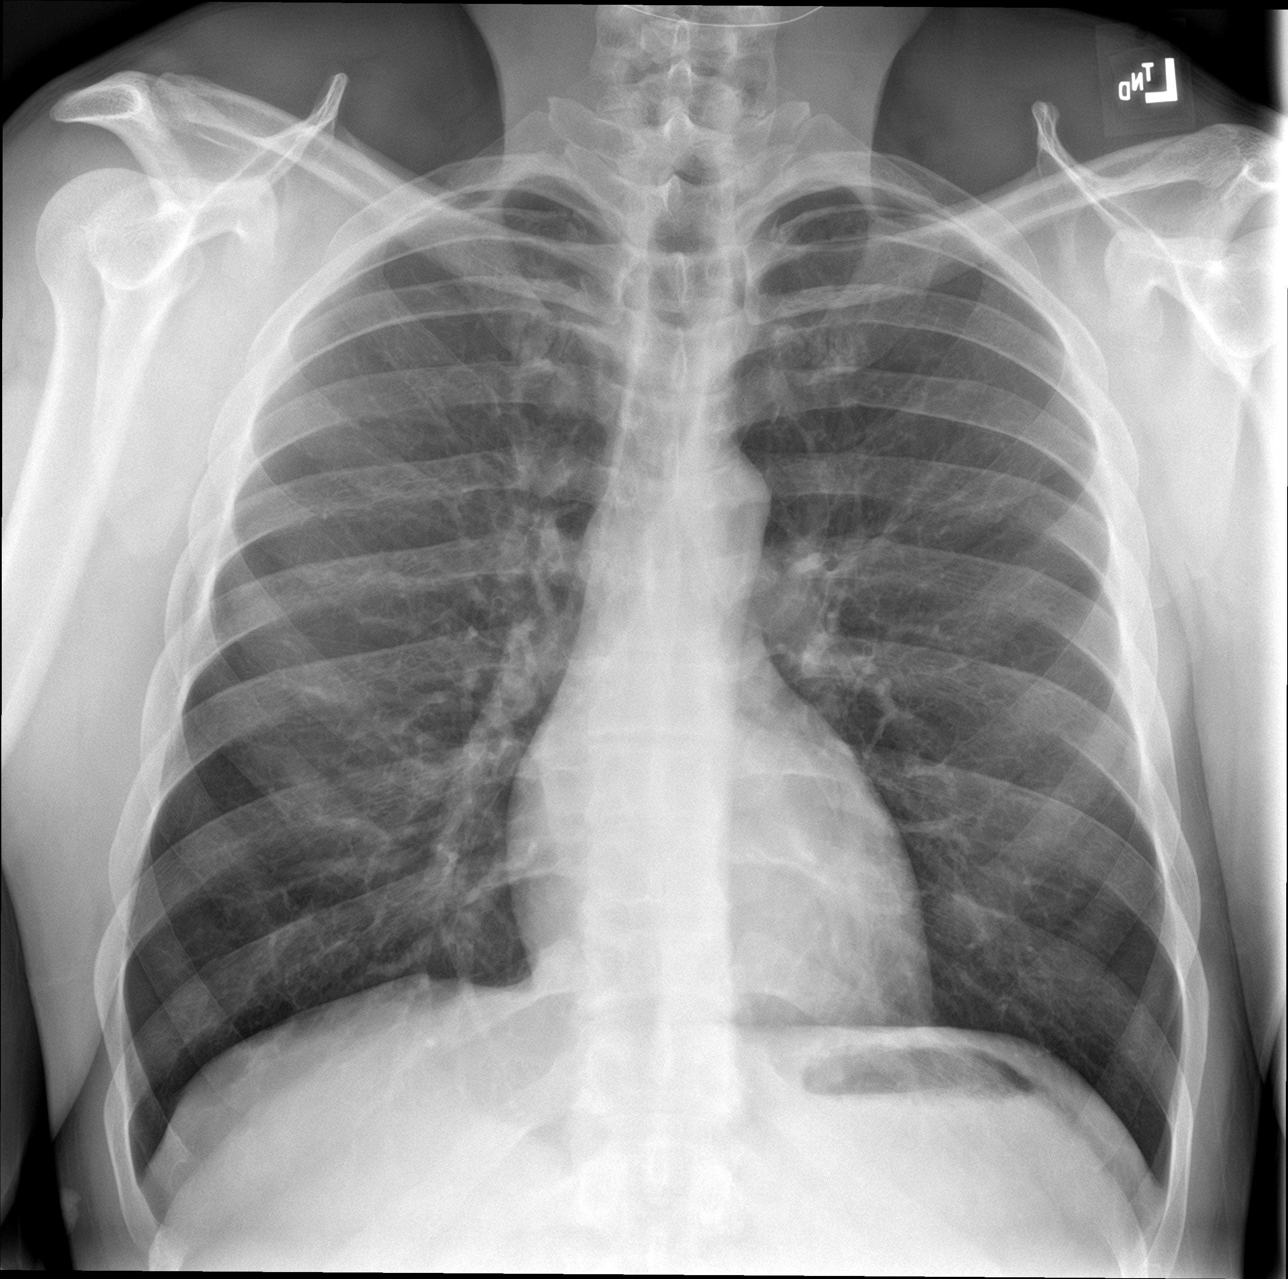

[abdomen erect]
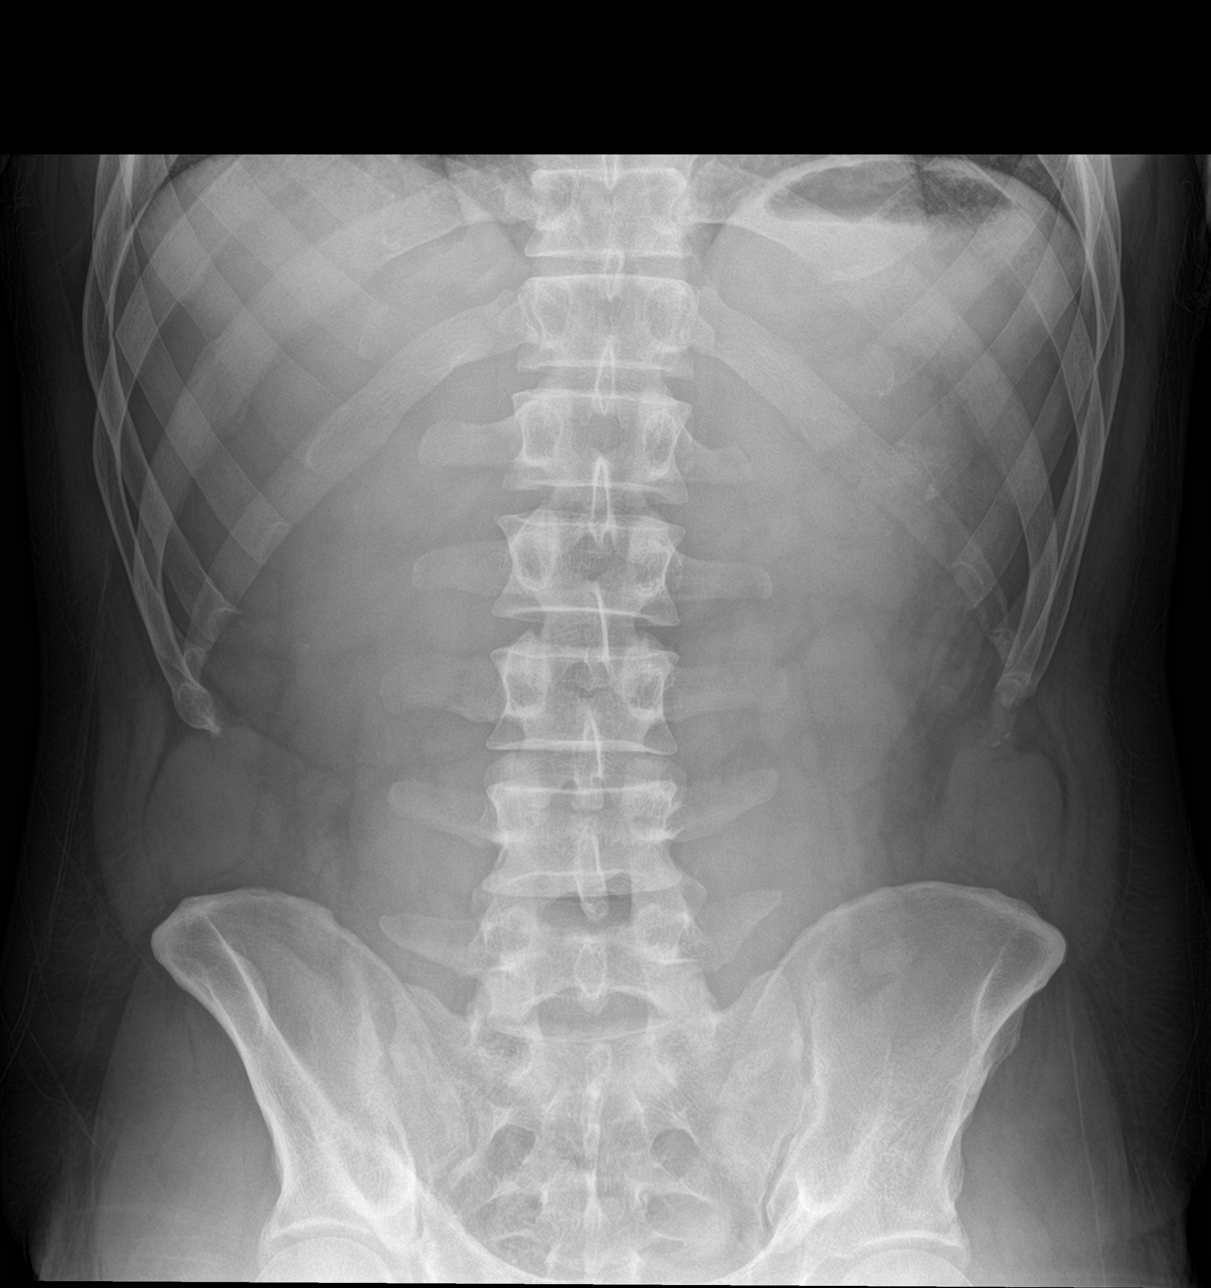

[abdomen supine]
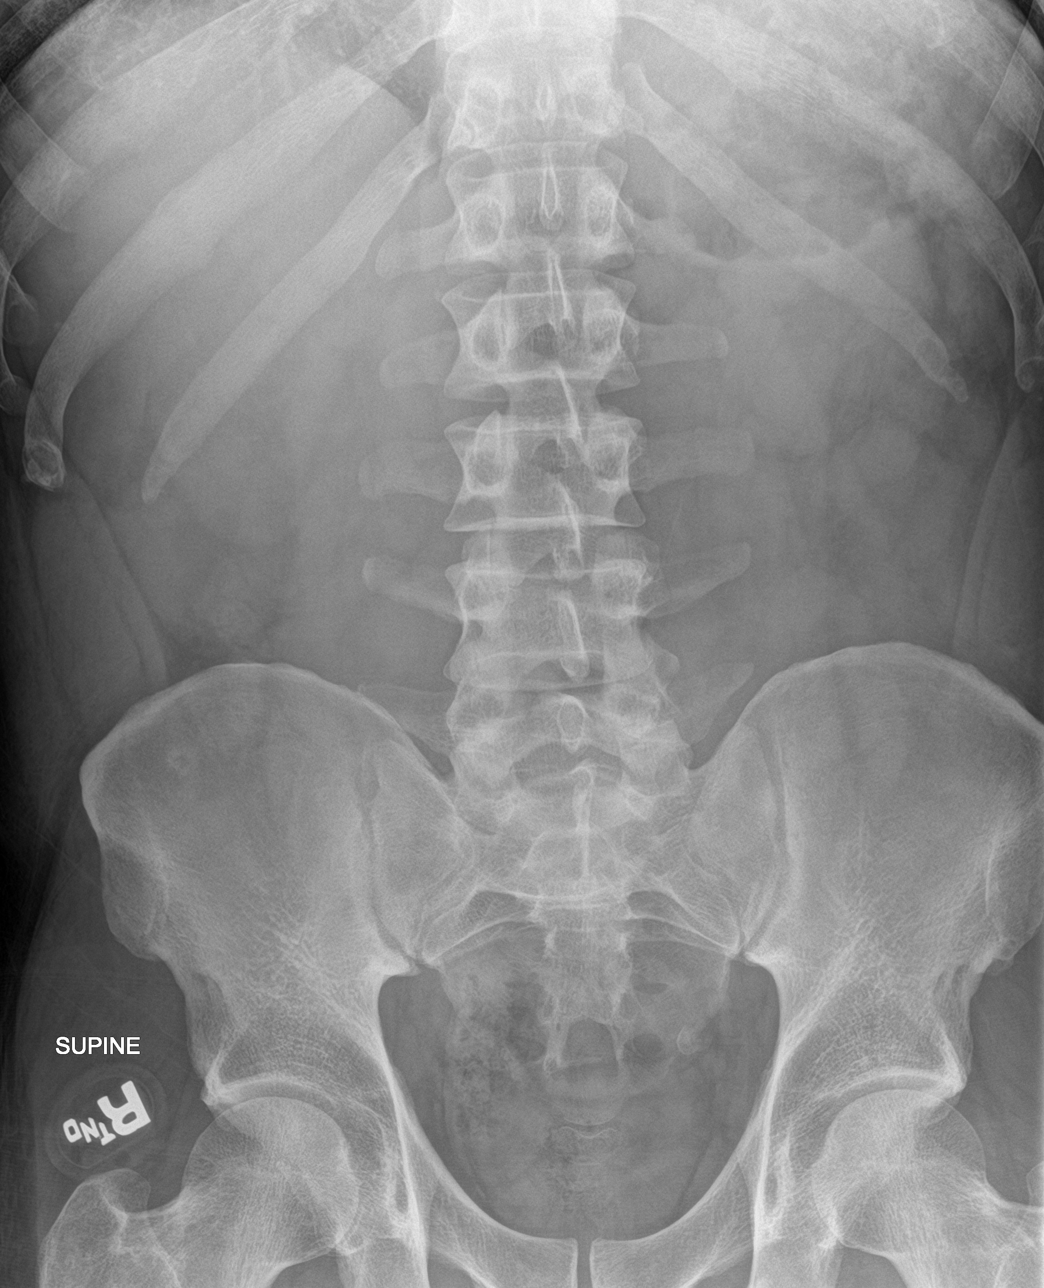

[3 of 3 positions shown; findings below may reference images not displayed]

FINDINGS: There is no evidence of dilated bowel loops or free intraperitoneal
air. No radiopaque calculi or other significant radiographic
abnormality is seen. Heart size and mediastinal contours are within
normal limits. Both lungs are clear.
IMPRESSION: Negative abdominal radiographs.  No acute cardiopulmonary disease.

## 2024-09-03 ENCOUNTER — Emergency Department
Admission: EM | Admit: 2024-09-03 | Discharge: 2024-09-03 | Disposition: A | Discharge status: Home / Self Care | Payer: Self-pay | Attending: Emergency Medicine | Admitting: Emergency Medicine

## 2024-09-03 ENCOUNTER — Other Ambulatory Visit: Payer: Self-pay

## 2024-09-03 DIAGNOSIS — K047 Periapical abscess without sinus: Secondary | ICD-10-CM | POA: Insufficient documentation

## 2024-09-03 MED ORDER — TRAMADOL HCL 50 MG PO TABS: 50.0000 mg | ORAL_TABLET | Freq: Four times a day (QID) | ORAL | 0 refills | Status: AC | PRN

## 2024-09-03 MED ORDER — AMOXICILLIN 500 MG PO CAPS: 500.0000 mg | ORAL_CAPSULE | Freq: Three times a day (TID) | ORAL | 0 refills | Status: AC

## 2024-09-03 NOTE — Discharge Instructions (Signed)

## 2024-09-03 NOTE — ED Provider Notes (Signed)
 "  [facility] Provider Note    Event Date/Time   First MD Initiated Contact with Patient 09/03/24 1152     (approximate)   History   Dental Pain   HPI  Alexander Garcia is a 48 y.o. male history of drug abuse presents emergency department with left-sided dental pain for 2 days.  Patient states he has abscess and swelling to the left jaw.  No fever or chills.  Just pain in the area.  No regular dentist.      Physical Exam   Triage Vital Signs: ED Triage Vitals  Encounter Vitals Group     BP 09/03/24 1106 127/77     Girls Systolic BP Percentile --      Girls Diastolic BP Percentile --      Boys Systolic BP Percentile --      Boys Diastolic BP Percentile --      Pulse Rate 09/03/24 1106 63     Resp 09/03/24 1106 18     Temp 09/03/24 1106 98 F (36.7 C)     Temp src --      SpO2 09/03/24 1106 100 %     Weight 09/03/24 1104 202 lb (91.6 kg)     Height 09/03/24 1104 6' (1.829 m)     Head Circumference --      Peak Flow --      Pain Score 09/03/24 1104 6     Pain Loc --      Pain Education --      Exclude from Growth Chart --     Most recent vital signs: Vitals:   09/03/24 1106 09/03/24 1230  BP: 127/77 116/70  Pulse: 63 (!) 57  Resp: 18 18  Temp: 98 F (36.7 C)   SpO2: 100% 100%     General: Awake, no distress.   CV:  Good peripheral perfusion.  Resp:  Normal effort.  Abd:  No distention.   Other:  Poor dentition noted, swelling noted on the left lower gumline, area is tender to palpation   ED Results / Procedures / Treatments   Labs (all labs ordered are listed, but only abnormal results are displayed) Labs Reviewed - No data to display   EKG     RADIOLOGY     PROCEDURES:   Procedures  Critical Care:  no Chief Complaint  Patient presents with   Dental Pain      MEDICATIONS ORDERED IN ED: Medications - No data to display   IMPRESSION / MDM / ASSESSMENT AND PLAN / ED COURSE  I reviewed the triage vital  signs and the nursing notes.                              Differential diagnosis includes, but is not limited to, dental pain, dental abscess, dental caries, malingering  Patient's presentation is most consistent with acute illness / injury with system symptoms.   Physical exam is consistent with dental abscess.  Patient was placed on amoxicillin , tramadol.  Can take Tylenol  and ibuprofen.  Follow-up with your regular doctor as needed.  Dental clinics provided at discharge.  Return if worsening.  Discharged stable condition.      FINAL CLINICAL IMPRESSION(S) / ED DIAGNOSES   Final diagnoses:  Dental abscess     Rx / DC Orders   ED Discharge Orders          Ordered    amoxicillin  (  AMOXIL ) 500 MG capsule  3 times daily        09/03/24 1212    traMADol (ULTRAM) 50 MG tablet  Every 6 hours PRN        09/03/24 1212             Note:  This document was prepared using Dragon voice recognition software and may include unintentional dictation errors.    Gasper Devere ORN, PA-C 09/03/24 1655    Arlander Charleston, MD 09/03/24 2026  "

## 2024-09-03 NOTE — ED Triage Notes (Signed)
 Pt comes with left sided dental pain for two days. Pt states abscess and swelling to jaw.

## 2024-09-26 ENCOUNTER — Emergency Department
Admission: EM | Admit: 2024-09-26 | Discharge: 2024-09-26 | Disposition: A | Discharge status: Home / Self Care | Payer: Self-pay

## 2024-09-26 ENCOUNTER — Other Ambulatory Visit: Payer: Self-pay

## 2024-09-26 ENCOUNTER — Encounter: Payer: Self-pay | Admitting: Emergency Medicine

## 2024-09-26 DIAGNOSIS — K047 Periapical abscess without sinus: Secondary | ICD-10-CM | POA: Insufficient documentation

## 2024-09-26 DIAGNOSIS — K029 Dental caries, unspecified: Secondary | ICD-10-CM | POA: Insufficient documentation

## 2024-09-26 MED ORDER — AMOXICILLIN-POT CLAVULANATE 875-125 MG PO TABS: 1.0000 | ORAL_TABLET | Freq: Two times a day (BID) | ORAL | 0 refills | Status: AC

## 2024-09-26 NOTE — ED Triage Notes (Signed)
 Pt endorses left lower dental pain and swelling.

## 2024-09-26 NOTE — ED Notes (Signed)
 Pt verbalized understanding of discharge instructions. Opportunity for questions provided.

## 2024-09-26 NOTE — Discharge Instructions (Signed)
 Take the antibiotics as prescribed.  Make sure you take all of the medication even if you begin to feel better.  Follow-up with the dentist.  I have attached options.
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
## 2024-09-26 NOTE — ED Notes (Signed)
 ED Provider at bedside.
